# Patient Record
Sex: Male | Born: 1975 | Race: Black or African American | Hispanic: No
Health system: Southern US, Community
[De-identification: ages and names within clinical notes are randomized; demographics above are authoritative.]

---

## 2010-08-16 ENCOUNTER — Inpatient Hospital Stay (HOSPITAL_COMMUNITY): Admission: EM | Admit: 2010-08-16 | Discharge: 2010-08-19 | Payer: Self-pay

## 2010-08-16 ENCOUNTER — Emergency Department (HOSPITAL_COMMUNITY)
Admission: EM | Admit: 2010-08-16 | Discharge: 2010-08-16 | Payer: Self-pay | Source: Home / Self Care | Admitting: Emergency Medicine
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
## 2010-11-02 LAB — CBC
HCT: 36.7 % — ABNORMAL LOW (ref 39.0–52.0)
MCH: 30.3 pg (ref 26.0–34.0)
MCH: 31.8 pg (ref 26.0–34.0)
MCHC: 36 g/dL (ref 30.0–36.0)
MCV: 88.3 fL (ref 78.0–100.0)
RDW: 12.1 % (ref 11.5–15.5)
RDW: 12.2 % (ref 11.5–15.5)
WBC: 13.4 10*3/uL — ABNORMAL HIGH (ref 4.0–10.5)

## 2010-11-02 LAB — DIFFERENTIAL
Lymphocytes Relative: 13 % (ref 12–46)
Lymphs Abs: 1.7 10*3/uL (ref 0.7–4.0)
Monocytes Relative: 10 % (ref 3–12)
Neutrophils Relative %: 78 % — ABNORMAL HIGH (ref 43–77)

## 2010-11-02 LAB — BASIC METABOLIC PANEL
GFR calc Af Amer: >60 mL/min (ref >60)
Glucose, Bld: 95 mg/dL (ref 70–99)
Potassium: 3.5 mEq/L (ref 3.5–5.1)
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
## 2011-11-25 IMAGING — CR DG CHEST 2V
2 series · 2 of 2 positions shown · non-contrast
Comparison: 7507 hours the same day.

CLINICAL DATA: 34-year-old male status post chest tube removal.

CHEST - 2 VIEW

[w chest pa]
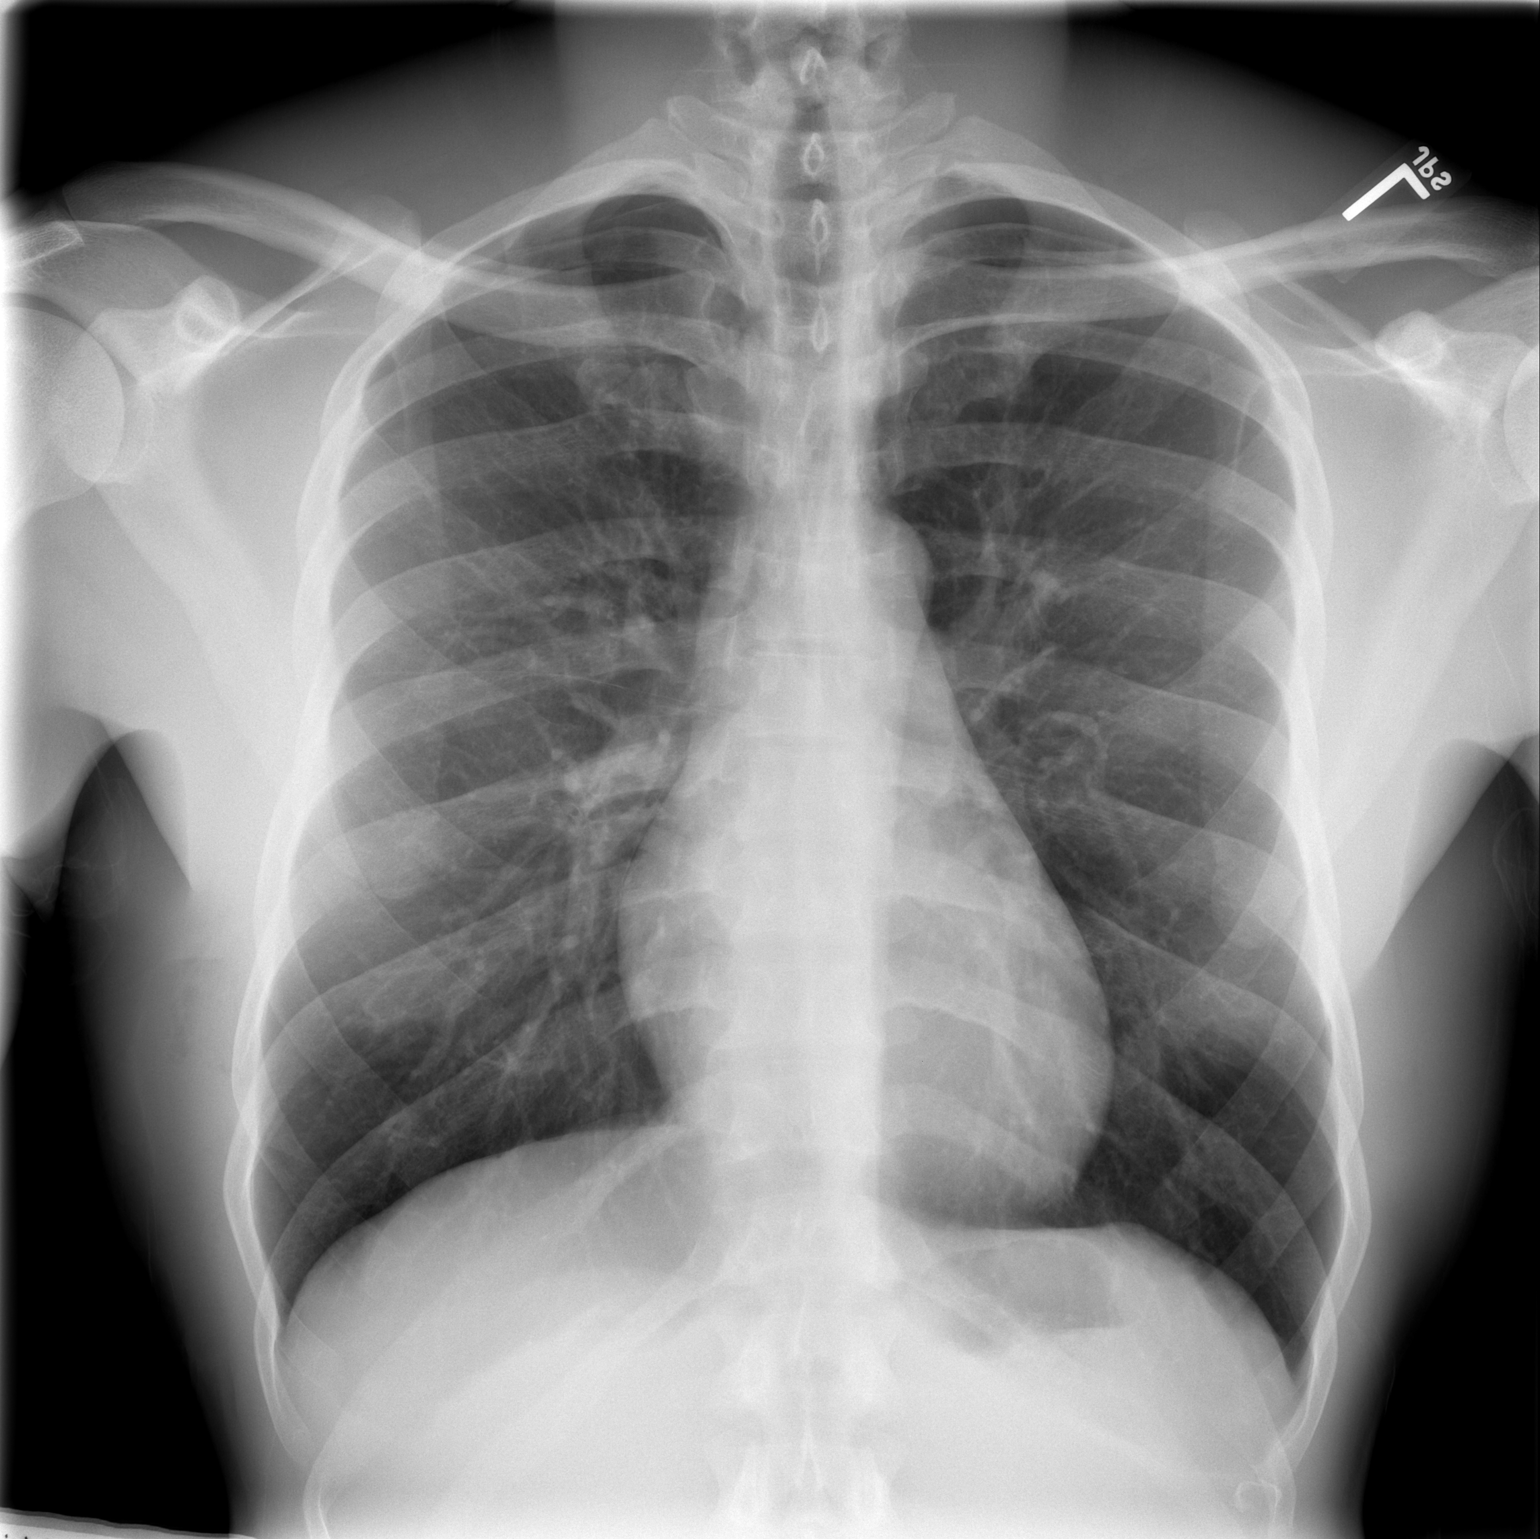

[w chest lat]
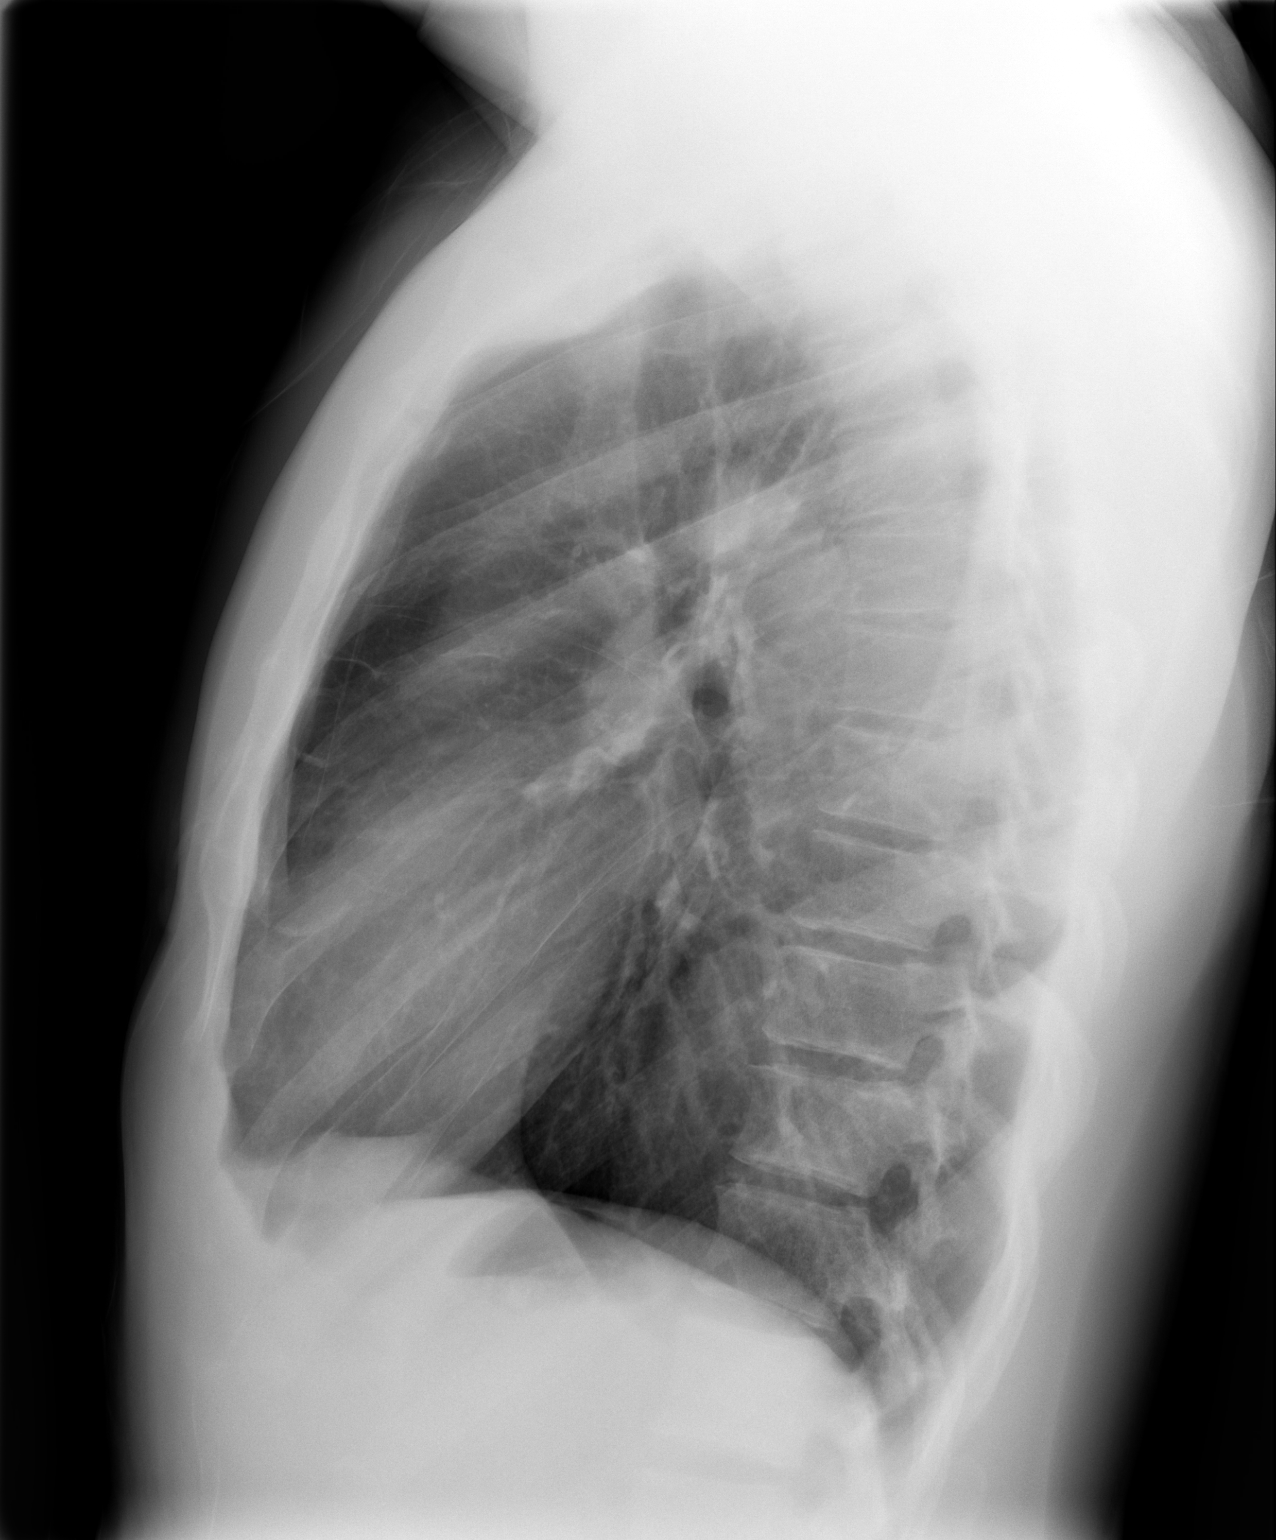

[2 of 2 positions shown; findings below may reference images not displayed]

FINDINGS: Small residual right pneumothorax, stable in size
following right side chest tube removal.  Normal lung volumes.
Normal cardiac size and mediastinal contours.  Visualized tracheal
air column is within normal limits.  Lung parenchyma remains clear.
Stable visualized osseous structures.
IMPRESSION: Stable small right pneumothorax following right chest tube removal.

## 2023-01-19 ENCOUNTER — Emergency Department (HOSPITAL_COMMUNITY): Payer: BLUE CROSS/BLUE SHIELD

## 2023-01-19 ENCOUNTER — Other Ambulatory Visit: Payer: Self-pay

## 2023-01-19 ENCOUNTER — Emergency Department (HOSPITAL_COMMUNITY)
Admission: EM | Admit: 2023-01-19 | Discharge: 2023-01-19 | Disposition: A | Discharge status: Home / Self Care | Payer: BLUE CROSS/BLUE SHIELD | Attending: Emergency Medicine | Admitting: Emergency Medicine

## 2023-01-19 DIAGNOSIS — R1031 Right lower quadrant pain: Secondary | ICD-10-CM | POA: Diagnosis present

## 2023-01-19 DIAGNOSIS — K529 Noninfective gastroenteritis and colitis, unspecified: Secondary | ICD-10-CM

## 2023-01-19 LAB — COMPREHENSIVE METABOLIC PANEL
ALT: 17 U/L (ref 0–44)
AST: 18 U/L (ref 15–41)
Albumin: 4.2 g/dL (ref 3.5–5.0)
Alkaline Phosphatase: 48 U/L (ref 38–126)
Anion gap: 10 (ref 5–15)
BUN: 11 mg/dL (ref 6–20)
CO2: 25 mmol/L (ref 22–32)
Calcium: 9.2 mg/dL (ref 8.9–10.3)
Chloride: 103 mmol/L (ref 98–111)
Creatinine, Ser: 1.23 mg/dL (ref 0.61–1.24)
GFR, Estimated: >60 mL/min (ref >60)
Glucose, Bld: 92 mg/dL (ref 70–99)
Potassium: 3.9 mmol/L (ref 3.5–5.1)
Sodium: 138 mmol/L (ref 135–145)
Total Bilirubin: 1.6 mg/dL — ABNORMAL HIGH (ref 0.3–1.2)
Total Protein: 7.8 g/dL (ref 6.5–8.1)

## 2023-01-19 LAB — CBC
HCT: 41 % (ref 39.0–52.0)
Hemoglobin: 13.9 g/dL (ref 13.0–17.0)
MCH: 30.7 pg (ref 26.0–34.0)
MCHC: 33.9 g/dL (ref 30.0–36.0)
MCV: 90.5 fL (ref 80.0–100.0)
Platelets: 207 10*3/uL (ref 150–400)
RBC: 4.53 MIL/uL (ref 4.22–5.81)
RDW: 12 % (ref 11.5–15.5)
WBC: 9 10*3/uL (ref 4.0–10.5)
nRBC: 0 % (ref 0.0–0.2)

## 2023-01-19 LAB — URINALYSIS, ROUTINE W REFLEX MICROSCOPIC
Bilirubin Urine: NEGATIVE
Glucose, UA: NEGATIVE mg/dL
Ketones, ur: 5 mg/dL — AB
Leukocytes,Ua: NEGATIVE
Nitrite: NEGATIVE
Protein, ur: 30 mg/dL — AB
Specific Gravity, Urine: 1.023 (ref 1.005–1.030)
pH: 5 (ref 5.0–8.0)

## 2023-01-19 LAB — LIPASE, BLOOD: Lipase: 26 U/L (ref 11–51)

## 2023-01-19 MED ORDER — MORPHINE SULFATE (PF) 4 MG/ML IV SOLN
4.0000 mg | Freq: Once | INTRAVENOUS | Status: AC
  Administered 2023-01-19: 4 mg via INTRAVENOUS
  Filled 2023-01-19: qty 1

## 2023-01-19 MED ORDER — AMOXICILLIN-POT CLAVULANATE 875-125 MG PO TABS: 1.0000 | ORAL_TABLET | Freq: Two times a day (BID) | ORAL | 0 refills | Status: AC

## 2023-01-19 MED ORDER — ONDANSETRON HCL 4 MG PO TABS: 4.0000 mg | ORAL_TABLET | Freq: Three times a day (TID) | ORAL | 0 refills | Status: AC | PRN

## 2023-01-19 MED ORDER — ONDANSETRON HCL 4 MG/2ML IJ SOLN
4.0000 mg | Freq: Once | INTRAMUSCULAR | Status: AC
  Administered 2023-01-19: 4 mg via INTRAVENOUS
  Filled 2023-01-19: qty 2

## 2023-01-19 MED ORDER — IOHEXOL 300 MG/ML  SOLN
100.0000 mL | Freq: Once | INTRAMUSCULAR | Status: AC | PRN
  Administered 2023-01-19: 100 mL via INTRAVENOUS

## 2023-01-19 MED ORDER — AMOXICILLIN-POT CLAVULANATE 875-125 MG PO TABS
1.0000 | ORAL_TABLET | Freq: Once | ORAL | Status: AC
  Administered 2023-01-19: 1 via ORAL
  Filled 2023-01-19: qty 1

## 2023-01-19 NOTE — Discharge Instructions (Signed)
Take the entire course of the antibiotics prescribed.  You have been also prescribed Zofran to help if your nausea or vomiting returns.  I do recommend Imodium, you do not need a prescription for this to help you with the diarrhea.  You may slowly increase your diet to a regular diet once your symptoms are improving, over the next 1 to 2 days I do suggest the BRAT diet which stands for bananas rice applesauce and dry toast.

## 2023-01-19 NOTE — ED Provider Notes (Signed)
EMERGENCY DEPARTMENT AT [facility] Provider Note   CSN: [id number] Arrival date & time: 01/19/23  1445     History  Chief Complaint  Patient presents with   Abdominal Pain    Kevin Moses is a 47 y.o. male presenting with a 4 day history of RLQ pain associated initially with constipation, but has since had multiple episodes of nonbloody diarrhea.  Pain worsens with bm's causing diaphoresis and pain escalation.  He endorses some pain in his right flank.  He does endorse increased urinary frequency, no hematuria, no hx of kidney stones.  Also denies n/v, abdominal distention but felt his rlq was swollen last night, better today.  He has had no tx prior to arrival.  Pain is worse with meals, he has been able to tolerate PO fluids.   The history is provided by the patient.       Home Medications Prior to Admission medications   Medication Sig Start Date End Date Taking? Authorizing Provider  amoxicillin-clavulanate (AUGMENTIN) 875-125 MG tablet Take 1 tablet by mouth every 12 (twelve) hours. 01/19/23  Yes Anjannette Gauger, Raynelle Fanning, PA-C  ondansetron (ZOFRAN) 4 MG tablet Take 1 tablet (4 mg total) by mouth every 8 (eight) hours as needed for nausea or vomiting. 01/19/23  Yes Elene Downum, Raynelle Fanning, PA-C      Allergies    Patient has no known allergies.    Review of Systems   Review of Systems  Constitutional:  Positive for diaphoresis. Negative for fever.  HENT:  Negative for congestion and sore throat.   Eyes: Negative.   Respiratory:  Negative for chest tightness and shortness of breath.   Cardiovascular:  Negative for chest pain.  Gastrointestinal:  Positive for abdominal pain, constipation and diarrhea. Negative for nausea and vomiting.  Genitourinary:  Positive for flank pain and frequency.  Musculoskeletal:  Negative for arthralgias, joint swelling and neck pain.  Skin: Negative.  Negative for rash and wound.  Neurological:  Negative for dizziness, weakness, light-headedness,  numbness and headaches.  Psychiatric/Behavioral: Negative.      Physical Exam Updated Vital Signs BP 132/84   Pulse 84   Temp 98.2 F (36.8 C)   Resp 18   Ht 6\' 6"  (1.981 m)   Wt 86.6 kg   SpO2 98%   BMI 22.07 kg/m  Physical Exam Vitals and nursing note reviewed.  Constitutional:      Appearance: He is well-developed.  HENT:     Head: Normocephalic and atraumatic.  Eyes:     Conjunctiva/sclera: Conjunctivae normal.  Cardiovascular:     Rate and Rhythm: Normal rate and regular rhythm.     Heart sounds: Normal heart sounds.  Pulmonary:     Effort: Pulmonary effort is normal.     Breath sounds: Normal breath sounds. No wheezing.  Abdominal:     General: Bowel sounds are normal.     Palpations: Abdomen is soft.     Tenderness: There is abdominal tenderness in the right lower quadrant. There is no right CVA tenderness, guarding or rebound.  Musculoskeletal:        General: Normal range of motion.     Cervical back: Normal range of motion.  Skin:    General: Skin is warm and dry.  Neurological:     Mental Status: He is alert.     ED Results / Procedures / Treatments   Labs (all labs ordered are listed, but only abnormal results are displayed) Labs Reviewed  COMPREHENSIVE METABOLIC PANEL -  Abnormal; Notable for the following components:      Result Value   Total Bilirubin 1.6 (*)    All other components within normal limits  URINALYSIS, ROUTINE W REFLEX MICROSCOPIC - Abnormal; Notable for the following components:   Hgb urine dipstick MODERATE (*)    Ketones, ur 5 (*)    Protein, ur 30 (*)    Bacteria, UA RARE (*)    All other components within normal limits  LIPASE, BLOOD  CBC    EKG None  Radiology No results found. Results for orders placed or performed during the hospital encounter of 01/19/23  Lipase, blood  Result Value Ref Range   Lipase 26 11 - 51 U/L  Comprehensive metabolic panel  Result Value Ref Range   Sodium 138 135 - 145 mmol/L    Potassium 3.9 3.5 - 5.1 mmol/L   Chloride 103 98 - 111 mmol/L   CO2 25 22 - 32 mmol/L   Glucose, Bld 92 70 - 99 mg/dL   BUN 11 6 - 20 mg/dL   Creatinine, Ser 8.29 0.61 - 1.24 mg/dL   Calcium 9.2 8.9 - 56.2 mg/dL   Total Protein 7.8 6.5 - 8.1 g/dL   Albumin 4.2 3.5 - 5.0 g/dL   AST 18 15 - 41 U/L   ALT 17 0 - 44 U/L   Alkaline Phosphatase 48 38 - 126 U/L   Total Bilirubin 1.6 (H) 0.3 - 1.2 mg/dL   GFR, Estimated >13 >08 mL/min   Anion gap 10 5 - 15  CBC  Result Value Ref Range   WBC 9.0 4.0 - 10.5 K/uL   RBC 4.53 4.22 - 5.81 MIL/uL   Hemoglobin 13.9 13.0 - 17.0 g/dL   HCT 65.7 84.6 - 96.2 %   MCV 90.5 80.0 - 100.0 fL   MCH 30.7 26.0 - 34.0 pg   MCHC 33.9 30.0 - 36.0 g/dL   RDW 95.2 84.1 - 32.4 %   Platelets 207 150 - 400 K/uL   nRBC 0.0 0.0 - 0.2 %  Urinalysis, Routine w reflex microscopic -Urine, Clean Catch  Result Value Ref Range   Color, Urine YELLOW YELLOW   APPearance CLEAR CLEAR   Specific Gravity, Urine 1.023 1.005 - 1.030   pH 5.0 5.0 - 8.0   Glucose, UA NEGATIVE NEGATIVE mg/dL   Hgb urine dipstick MODERATE (A) NEGATIVE   Bilirubin Urine NEGATIVE NEGATIVE   Ketones, ur 5 (A) NEGATIVE mg/dL   Protein, ur 30 (A) NEGATIVE mg/dL   Nitrite NEGATIVE NEGATIVE   Leukocytes,Ua NEGATIVE NEGATIVE   RBC / HPF 0-5 0 - 5 RBC/hpf   WBC, UA 0-5 0 - 5 WBC/hpf   Bacteria, UA RARE (A) NONE SEEN   Squamous Epithelial / HPF 0-5 0 - 5 /HPF   Mucus PRESENT    CT ABDOMEN PELVIS W CONTRAST  Result Date: 01/19/2023 CLINICAL DATA:  Right-sided abdominal pain for several days, initial encounter EXAM: CT ABDOMEN AND PELVIS WITH CONTRAST TECHNIQUE: Multidetector CT imaging of the abdomen and pelvis was performed using the standard protocol following bolus administration of intravenous contrast. RADIATION DOSE REDUCTION: This exam was performed according to the departmental dose-optimization program which includes automated exposure control, adjustment of the mA and/or kV according to  patient size and/or use of iterative reconstruction technique. CONTRAST:  OMNIPAQUE IOHEXOL 300 MG/ML  SOLN COMPARISON:  None Available. FINDINGS: Lower chest: No acute abnormality. Hepatobiliary: No focal liver abnormality is seen. No gallstones, gallbladder wall thickening, or biliary  dilatation. Pancreas: Unremarkable. No pancreatic ductal dilatation or surrounding inflammatory changes. Spleen: Normal in size without focal abnormality. Adrenals/Urinary Tract: Adrenal glands are within normal limits. Kidneys demonstrate a normal enhancement pattern bilaterally. No renal calculi or obstructive changes are seen. The bladder is partially distended. Stomach/Bowel: The appendix is air-filled and within normal limits. Adjacent to the ascending colon there is some inflammatory change identified this likely represents some focal colitis. No evidence of perforation is noted at this time. More distal colon appears within normal limits. Small bowel and stomach are unremarkable. Vascular/Lymphatic: No significant vascular findings are present. No enlarged abdominal or pelvic lymph nodes. Reproductive: Prostate is unremarkable. Other: No abdominal wall hernia or abnormality. No abdominopelvic ascites. Musculoskeletal: No acute or significant osseous findings. IMPRESSION: Focal colitis in the ascending colon with mild pericolonic fluid. The appendix specifically is within normal limits. No evidence of perforation is seen. No other focal abnormality is noted. Electronically Signed   By: Alcide Clever M.D.   On: 01/19/2023 17:50    Procedures Procedures    Medications Ordered in ED Medications  morphine (PF) 4 MG/ML injection 4 mg (4 mg Intravenous Given 01/19/23 1703)  ondansetron (ZOFRAN) injection 4 mg (4 mg Intravenous Given 01/19/23 1703)  iohexol (OMNIPAQUE) 300 MG/ML solution 100 mL (100 mLs Intravenous Contrast Given 01/19/23 1711)  amoxicillin-clavulanate (AUGMENTIN) 875-125 MG per tablet 1 tablet (1 tablet  Oral Given 01/19/23 1902)    ED Course/ Medical Decision Making/ A&P                             Medical Decision Making Pt presenting with rlq pain, flank pain, increased urinary frequency, diarrhea, subjective fever. Ddx including acute appendicitis, sbo, colitis, kidney stone, diverticulitis, uti, pyelo.  Amount and/or Complexity of Data Reviewed Labs: ordered.    Details: Reviewed, normal except elevated bilirubin, normal wbc,  normal lfts and lipase,  urine negative for infection. Radiology: ordered.    Details: Ct reviewed, right ascending colitis. No perf, normal appendix.  Risk Prescription drug management.           Final Clinical Impression(s) / ED Diagnoses Final diagnoses:  Colitis    Rx / DC Orders ED Discharge Orders          Ordered    amoxicillin-clavulanate (AUGMENTIN) 875-125 MG tablet  Every 12 hours        01/19/23 1906    ondansetron (ZOFRAN) 4 MG tablet  Every 8 hours PRN        01/19/23 1907              Victoriano Lain 01/22/23 2154    Vanetta Mulders, MD 01/22/23 2336

## 2023-01-19 NOTE — ED Provider Triage Note (Signed)
Emergency Medicine Provider Triage Evaluation Note  Kevin Moses , a 47 y.o. male  was evaluated in triage.  Pt complains of day 4 of right lower abdominal pain with radiation into his right flank.  His pain is worsened when he takes a deep breath and with movement.  He has had subjective fever, no nausea or vomiting but has had episodes of diarrhea, nonbloody with worsened pain after a bowel movement.  He has had urinary frequency he has noted no hematuria but states his urine has been cloudy.  Review of Systems  Positive: Abdominal pain, subjective fever, diarrhea Negative: Chest pain, shortness of breath, dysuria  Physical Exam  BP 126/86 (BP Location: Right Arm)   Pulse 79   Temp 98.2 F (36.8 C)   Resp 18   Ht 6\' 6"  (1.981 m)   Wt 86.6 kg   SpO2 97%   BMI 22.07 kg/m  Gen:   Awake, no distress   Resp:  Normal effort  MSK:   Moves extremities without difficulty  Other:    Medical Decision Making  Medically screening exam initiated at 4:41 PM.  Appropriate orders placed.  TORRE CARULLI was informed that the remainder of the evaluation will be completed by another provider, this initial triage assessment does not replace that evaluation, and the importance of remaining in the ED until their evaluation is complete.     Burgess Amor, PA-C 01/19/23 1643

## 2023-01-19 NOTE — ED Triage Notes (Signed)
Pt reports RLQ abd pain since Saturday night with fever, constipation, "bowel movements causing profuse sweating" followed by diarrhea. Pt reports constant pain rated 10/10 RLQ radiating to flank. Slight rebound tenderness reported. Family states the entire area was swollen last night but that resolved overnight. Pt reports urinary frequency. Denies hematuria, no blood in stool. No abd surgical history. No meds PTA today.
